# Patient Record
Sex: Female | Born: 1990
Health system: Southern US, Community
[De-identification: ages and names within clinical notes are randomized; demographics above are authoritative.]

## PROBLEM LIST (undated history)

## (undated) DIAGNOSIS — I341 Nonrheumatic mitral (valve) prolapse: Secondary | ICD-10-CM

## (undated) DIAGNOSIS — I10 Essential (primary) hypertension: Secondary | ICD-10-CM

## (undated) HISTORY — PX: TUBAL LIGATION: SHX77

## (undated) HISTORY — PX: ENDOMETRIAL ABLATION: SHX621

---

## 2016-10-12 ENCOUNTER — Encounter (HOSPITAL_BASED_OUTPATIENT_CLINIC_OR_DEPARTMENT_OTHER): Payer: Self-pay | Admitting: *Deleted

## 2016-10-12 ENCOUNTER — Emergency Department (HOSPITAL_BASED_OUTPATIENT_CLINIC_OR_DEPARTMENT_OTHER)
Admission: EM | Admit: 2016-10-12 | Discharge: 2016-10-12 | Disposition: A | Discharge status: Home / Self Care | Payer: Medicaid Other | Attending: Emergency Medicine | Admitting: Emergency Medicine

## 2016-10-12 DIAGNOSIS — J029 Acute pharyngitis, unspecified: Secondary | ICD-10-CM | POA: Insufficient documentation

## 2016-10-12 DIAGNOSIS — F17200 Nicotine dependence, unspecified, uncomplicated: Secondary | ICD-10-CM | POA: Diagnosis not present

## 2016-10-12 HISTORY — DX: Nonrheumatic mitral (valve) prolapse: I34.1

## 2016-10-12 LAB — RAPID STREP SCREEN (MED CTR MEBANE ONLY): Streptococcus, Group A Screen (Direct): NEGATIVE

## 2016-10-12 MED ORDER — MELOXICAM 15 MG PO TABS: 15.0000 mg | ORAL_TABLET | Freq: Every day | ORAL | 0 refills | Status: DC | PRN

## 2016-10-12 MED ORDER — DEXAMETHASONE 6 MG PO TABS
12.0000 mg | ORAL_TABLET | Freq: Once | ORAL | Status: AC
  Administered 2016-10-12: 12 mg via ORAL
  Filled 2016-10-12: qty 2

## 2016-10-12 MED FILL — MELOXICAM 15 MG TABLET: 15 | 7 days supply | Qty: 7 | Fill #0

## 2016-10-12 NOTE — ED Provider Notes (Signed)
[facility]   CSN: [id number] Arrival date & time: 10/12/16  4098     History   Chief Complaint Chief Complaint  Patient presents with  . Sore Throat    HPI Rose Ramos is a 26 y.o. female.  HPI   26 year old female with sore throat. Symptom onset Tuesday. Persistent since then. Subjective fever. Associated with intermittent and nonproductive cough. No ear pain.Has not tried taking anything for her symptoms.  Past Medical History:  Diagnosis Date  . Mitral valve prolapse     There are no active problems to display for this patient.   History reviewed. No pertinent surgical history.  OB History    No data available       Home Medications    Prior to Admission medications   Not on File    Family History History reviewed. No pertinent family history.  Social History Social History  Substance Use Topics  . Smoking status: Current Every Day Smoker  . Smokeless tobacco: Never Used  . Alcohol use Not on file     Allergies   Patient has no known allergies.   Review of Systems Review of Systems  All systems reviewed and negative, other than as noted in HPI.  Physical Exam Updated Vital Signs BP 128/76 (BP Location: Right Arm)   Pulse 100   Temp 98.4 F (36.9 C) (Oral)   Resp 16   Ht 5\' 3"  (1.6 m)   Wt 98.4 kg (217 lb)   LMP 10/05/2016   SpO2 100%   BMI 38.44 kg/m   Physical Exam  Constitutional: She appears well-developed and well-nourished. No distress.  HENT:  Head: Normocephalic and atraumatic.  Right Ear: External ear normal.  Left Ear: External ear normal.  Mouth/Throat: Oropharyngeal exudate present.  Exudative pharyngitis. Uvula midline. Normal sounding voice. Neck is supple. No adenopathy. Submental tissue soft.  Eyes: Conjunctivae are normal. Right eye exhibits no discharge. Left eye exhibits no discharge.  Neck: Neck supple.  Cardiovascular: Normal rate, regular rhythm and normal heart sounds.  Exam  reveals no gallop and no friction rub.   No murmur heard. Pulmonary/Chest: Effort normal and breath sounds normal. No respiratory distress.  Abdominal: Soft. She exhibits no distension. There is no tenderness.  Musculoskeletal: She exhibits no edema or tenderness.  Neurological: She is alert.  Skin: Skin is warm and dry.  Psychiatric: She has a normal mood and affect. Her behavior is normal. Thought content normal.  Nursing note and vitals reviewed.    ED Treatments / Results  Labs (all labs ordered are listed, but only abnormal results are displayed) Labs Reviewed  RAPID STREP SCREEN (NOT AT [facility])  CULTURE, GROUP A STREP [facility])    EKG  EKG Interpretation None       Radiology No results found.  Procedures Procedures (including critical care time)  Medications Ordered in ED Medications  dexamethasone (DECADRON) tablet 12 mg (12 mg Oral Given 10/12/16 1191)     Initial Impression / Assessment and Plan / ED Course  I have reviewed the triage vital signs and the nursing notes.  Pertinent labs & imaging results that were available during my care of the patient were reviewed by me and considered in my medical decision making (see chart for details).     Exudative pharyngitis. No significant concern for airway compromise. Rapid strep negative. Symptomatic treatment.  Final Clinical Impressions(s) / ED Diagnoses   Final diagnoses:  Pharyngitis, unspecified etiology    New Prescriptions  New Prescriptions   No medications on file     Raeford RazorKohut, Jayceion Lisenby, MD 10/22/16 [phone]

## 2016-10-12 NOTE — ED Triage Notes (Signed)
Pt reports sore throat with intermittent cough and subjective temps x Tuesday.

## 2016-10-14 ENCOUNTER — Encounter (HOSPITAL_BASED_OUTPATIENT_CLINIC_OR_DEPARTMENT_OTHER): Payer: Self-pay | Admitting: Emergency Medicine

## 2016-10-14 ENCOUNTER — Emergency Department (HOSPITAL_BASED_OUTPATIENT_CLINIC_OR_DEPARTMENT_OTHER)
Admission: EM | Admit: 2016-10-14 | Discharge: 2016-10-14 | Disposition: A | Discharge status: Home / Self Care | Payer: Medicaid Other | Attending: Emergency Medicine | Admitting: Emergency Medicine

## 2016-10-14 DIAGNOSIS — F17200 Nicotine dependence, unspecified, uncomplicated: Secondary | ICD-10-CM | POA: Diagnosis not present

## 2016-10-14 DIAGNOSIS — Z79899 Other long term (current) drug therapy: Secondary | ICD-10-CM | POA: Diagnosis not present

## 2016-10-14 DIAGNOSIS — J029 Acute pharyngitis, unspecified: Secondary | ICD-10-CM | POA: Diagnosis not present

## 2016-10-14 DIAGNOSIS — K120 Recurrent oral aphthae: Secondary | ICD-10-CM | POA: Diagnosis not present

## 2016-10-14 LAB — CULTURE, GROUP A STREP (THRC)

## 2016-10-14 MED ORDER — CHLORHEXIDINE GLUCONATE 0.12 % MT SOLN: 15.0000 mL | Freq: Two times a day (BID) | OROMUCOSAL | 0 refills | Status: DC

## 2016-10-14 NOTE — ED Triage Notes (Signed)
Pt seen here Friday for sore throat, given meloxicam. Pt reports now having continued throat pain, sores in her mouth, and swollen lymph nodes to the L side of her neck.

## 2016-10-14 NOTE — ED Provider Notes (Signed)
[facility]   CSN: [id number] Arrival date & time: 10/14/16  1636     History   Chief Complaint Chief Complaint  Patient presents with  . Sore Throat    HPI Rose Ramos is a 26 y.o. female who presents with intermittent sore throat and sores in her mouth is been ongoing for the last 3 days. Patient was seen at [facility] ED on 10/12/16 for 4 days of sore throat. Her rapid strep was negative at that time. Conservative therapies were discussed with patient was discharged home with Modic for symptomatic relief. Patient reports that since then she has had intermittent  sore throat. She states additionally took one dose of of mobic. She states that the sore throat has improved and only occurs intermittently now. She denies any difficulty swallowing, difficulty tolerating PO, voice changes. Patient states she came to the emergency department today because she noticed some sores to the lower aspect of her lip. Patient denies any chest pain, difficulty breathing, nausea/vomiting, abdominal pain, dental pain, facial swelling.  The history is provided by the patient.    Past Medical History:  Diagnosis Date  . Mitral valve prolapse     There are no active problems to display for this patient.   History reviewed. No pertinent surgical history.  OB History    No data available       Home Medications    Prior to Admission medications   Medication Sig Start Date End Date Taking? Authorizing Provider  chlorhexidine (PERIDEX) 0.12 % solution Use as directed 15 mLs in the mouth or throat 2 (two) times daily. 10/14/16   Maxwell Caul, PA-C  meloxicam (MOBIC) 15 MG tablet Take 1 tablet (15 mg total) by mouth daily as needed for pain. 10/12/16   Raeford Razor, MD    Family History No family history on file.  Social History Social History  Substance Use Topics  . Smoking status: Current Every Day Smoker  . Smokeless tobacco: Never Used  . Alcohol use Not  on file     Allergies   Patient has no known allergies.   Review of Systems Review of Systems  Constitutional: Negative for fever.  HENT: Positive for sore throat. Negative for dental problem, drooling and facial swelling.        Lip sores  Respiratory: Negative for cough and shortness of breath.   Cardiovascular: Negative for chest pain.  Gastrointestinal: Negative for abdominal pain, nausea and vomiting.  Neurological: Negative for headaches.     Physical Exam Updated Vital Signs BP (!) 150/77 (BP Location: Left Arm)   Pulse 93   Temp 98.5 F (36.9 C) (Oral)   Resp 18   LMP 10/05/2016   SpO2 100%   Physical Exam  Constitutional: She appears well-developed and well-nourished.  Sitting comfortably on examination table  HENT:  Head: Normocephalic and atraumatic.  Mouth/Throat: Uvula is midline and mucous membranes are normal. No trismus in the jaw. Normal dentition. No dental abscesses. Posterior oropharyngeal erythema present.  Uvula is midline. No trismus, no evidence of peritonsillar abscess. No gingival erythema or swelling. No evidence of dental abscess. Posterior oropharynx is slightly erythematous but no exudates. No facial or neck swelling. Small aphthous ulcers noted to the anterior aspect of the lower lip and to the anterior aspect of the lower left lip. No other oral lesions.  Eyes: Conjunctivae and EOM are normal. Right eye exhibits no discharge. Left eye exhibits no discharge. No scleral icterus.  Neck: Normal range of motion.  Pulmonary/Chest: Effort normal.  Lymphadenopathy:    She has cervical adenopathy.       Left cervical: Superficial cervical adenopathy present.  Neurological: She is alert.  Skin: Skin is warm and dry.  Psychiatric: She has a normal mood and affect. Her speech is normal and behavior is normal.  Nursing note and vitals reviewed.    ED Treatments / Results  Labs (all labs ordered are listed, but only abnormal results are  displayed) Labs Reviewed - No data to display  EKG  EKG Interpretation None       Radiology No results found.  Procedures Procedures (including critical care time)  Medications Ordered in ED Medications - No data to display   Initial Impression / Assessment and Plan / ED Course  I have reviewed the triage vital signs and the nursing notes.  Pertinent labs & imaging results that were available during my care of the patient were reviewed by me and considered in my medical decision making (see chart for details).     26 year old female who presents with persistent sore throat and lip sores. Seen on 10/12/16 and diagnosed with pharyngitis. Conservative therapies discuss at home. Patient returns today because of sores on her lip. No fever, chills, difficulty swelling, difficulty breathing, vomiting. Patient is afebrile, non-toxic appearing, sitting comfortably on examination table. Vital signs reviewed and stable. Physical exam is consistent with aphthous ulcers located on the lower lip. No evidence of dental abscess or peritonsillar abscess. History/physical examination concerning for Ludwig angina or peritonsillar abscess. Instructed patient on conservative therapies at home and instructed her to do salt water rinses. Provided patient with a list of clinic resources to use if he does not have a PCP. Instructed to call them today to arrange follow-up in the next 24-48 hours. Strict return precautions discussed. Patient expresses understanding and agreement to plan.   Final Clinical Impressions(s) / ED Diagnoses   Final diagnoses:  Pharyngitis, unspecified etiology  Aphthous ulcer    New Prescriptions New Prescriptions   CHLORHEXIDINE (PERIDEX) 0.12 % SOLUTION    Use as directed 15 mLs in the mouth or throat 2 (two) times daily.     Maxwell CaulLayden, Anna Livers A, PA-C 10/14/16 1806    Gwyneth SproutPlunkett, Whitney, MD 10/15/16 [phone]

## 2016-10-14 NOTE — Discharge Instructions (Signed)
You can take Mobic, Tylenol or Ibuprofen as directed for pain.  As we discussed you can use the mouthwash or do salt water rinses.   Follow-up with her primary care doctor next 2-4 days. If he do not have a primary care doctor, he can use 1 listed above.  Return the emergency Department for any worsening sore throat, difficulty swallowing, difficulty breathing, vomiting, changes in voice, chest pain or any other worsening or concerning symptoms.

## 2016-11-02 ENCOUNTER — Encounter (HOSPITAL_BASED_OUTPATIENT_CLINIC_OR_DEPARTMENT_OTHER): Payer: Self-pay | Admitting: *Deleted

## 2016-11-02 ENCOUNTER — Emergency Department (HOSPITAL_BASED_OUTPATIENT_CLINIC_OR_DEPARTMENT_OTHER)
Admission: EM | Admit: 2016-11-02 | Discharge: 2016-11-02 | Disposition: A | Discharge status: Home / Self Care | Payer: Medicaid Other | Attending: Emergency Medicine | Admitting: Emergency Medicine

## 2016-11-02 DIAGNOSIS — R300 Dysuria: Secondary | ICD-10-CM | POA: Diagnosis not present

## 2016-11-02 DIAGNOSIS — M545 Low back pain: Secondary | ICD-10-CM | POA: Diagnosis present

## 2016-11-02 DIAGNOSIS — R1030 Lower abdominal pain, unspecified: Secondary | ICD-10-CM

## 2016-11-02 DIAGNOSIS — F17200 Nicotine dependence, unspecified, uncomplicated: Secondary | ICD-10-CM | POA: Insufficient documentation

## 2016-11-02 LAB — URINALYSIS, ROUTINE W REFLEX MICROSCOPIC
Bilirubin Urine: NEGATIVE
Glucose, UA: NEGATIVE mg/dL
Hgb urine dipstick: NEGATIVE
Ketones, ur: NEGATIVE mg/dL
Leukocytes, UA: NEGATIVE
NITRITE: NEGATIVE
PH: 6 (ref 5.0–8.0)
Protein, ur: NEGATIVE mg/dL
SPECIFIC GRAVITY, URINE: 1.025 (ref 1.005–1.030)

## 2016-11-02 LAB — PREGNANCY, URINE: PREG TEST UR: NEGATIVE

## 2016-11-02 NOTE — ED Triage Notes (Signed)
Pt reports dysuria with low back pain x last night, denies any fevers.

## 2016-11-02 NOTE — ED Provider Notes (Signed)
[facility]   CSN: [id number] Arrival date & time: 11/02/16  0704     History   Chief Complaint Chief Complaint  Patient presents with  . Dysuria    HPI Rose Ramos is a 26 y.o. female.  HPI Patient presents with low back pain that radiates to the lower abdomen. Not worse with movements. 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 Has had some dysuria. States the urine is cloudy and has had to go more frequently. This also began yesterday and this morning. No vaginal bleeding or discharge. States her this could be pregnant. Does not think there is a chance for an STD. She tends to get crampy pains before menses and she is due to start her menses but states this pain feels different. No fevers or chills. No flank pain. No rash. Past Medical History:  Diagnosis Date  . Mitral valve prolapse     There are no active problems to display for this patient.   History reviewed. No pertinent surgical history.  OB History    No data available       Home Medications    Prior to Admission medications   Not on File    Family History History reviewed. No pertinent family history.  Social History Social History  Substance Use Topics  . Smoking status: Current Every Day Smoker  . Smokeless tobacco: Never Used  . Alcohol use Not on file     Allergies   Patient has no known allergies.   Review of Systems Review of Systems  Constitutional: Negative for appetite change and fever.  HENT: Negative for congestion.   Cardiovascular: Negative for chest pain.  Gastrointestinal: Positive for abdominal pain.  Genitourinary: Negative for vaginal bleeding, vaginal discharge and vaginal pain.  Musculoskeletal: Positive for back pain.  Neurological: Negative for numbness.  Psychiatric/Behavioral: Negative for behavioral problems.     Physical Exam Updated Vital Signs BP 116/82 (BP Location: Left Arm)   Pulse 76   Temp 98.3 F  (36.8 C) (Oral)   Resp 16   Ht  (1.6 m)   Wt 98.4 kg (217 lb)   LMP 10/05/2016   SpO2 100%   BMI 38.44 kg/m   Physical Exam  Constitutional: She appears well-developed.  HENT:  Head: Normocephalic.  Eyes: Pupils are equal, round, and reactive to light.  Cardiovascular: Normal rate.   Pulmonary/Chest: Effort normal. No respiratory distress. She has no wheezes.  Abdominal: She exhibits no mass. There is no tenderness.  Musculoskeletal: She exhibits no edema.  No tenderness on lumbar spine or paraspinal area.  Neurological: She is alert.  Skin: Skin is warm.     ED Treatments / Results  Labs (all labs ordered are listed, but only abnormal results are displayed) Labs Reviewed  URINALYSIS, ROUTINE W REFLEX MICROSCOPIC  PREGNANCY, URINE    EKG  EKG Interpretation None       Radiology No results found.  Procedures Procedures (including critical care time)  Medications Ordered in ED Medications - No data to display   Initial Impression / Assessment and Plan / ED Course  I have reviewed the triage vital signs and the nursing notes.  Pertinent labs & imaging results that were available during my care of the patient were reviewed by me and considered in my medical decision making (see chart for details).     Patient presents with lower back and abdominal pain. Some dysuria. Urine is reassuring and  not pregnant. Initially planned for pelvic exam but patient deferred it. Will discharge home. Follow-up as needed.  Final Clinical Impressions(s) / ED Diagnoses   Final diagnoses:  Dysuria  Lower abdominal pain    New Prescriptions New Prescriptions   No medications on file     Benjiman Core, MD 11/02/16 [phone]

## 2016-11-15 ENCOUNTER — Encounter (HOSPITAL_BASED_OUTPATIENT_CLINIC_OR_DEPARTMENT_OTHER): Payer: Self-pay | Admitting: *Deleted

## 2016-11-15 ENCOUNTER — Emergency Department (HOSPITAL_BASED_OUTPATIENT_CLINIC_OR_DEPARTMENT_OTHER)
Admission: EM | Admit: 2016-11-15 | Discharge: 2016-11-15 | Disposition: A | Discharge status: Home / Self Care | Payer: Medicaid Other | Attending: Emergency Medicine | Admitting: Emergency Medicine

## 2016-11-15 ENCOUNTER — Emergency Department (HOSPITAL_BASED_OUTPATIENT_CLINIC_OR_DEPARTMENT_OTHER): Payer: Medicaid Other

## 2016-11-15 DIAGNOSIS — F17200 Nicotine dependence, unspecified, uncomplicated: Secondary | ICD-10-CM | POA: Insufficient documentation

## 2016-11-15 DIAGNOSIS — M79605 Pain in left leg: Secondary | ICD-10-CM | POA: Diagnosis not present

## 2016-11-15 DIAGNOSIS — Z79899 Other long term (current) drug therapy: Secondary | ICD-10-CM | POA: Diagnosis not present

## 2016-11-15 MED ORDER — IBUPROFEN 600 MG PO TABS: 600.0000 mg | ORAL_TABLET | Freq: Four times a day (QID) | ORAL | 0 refills | Status: DC | PRN

## 2016-11-15 MED ORDER — METHOCARBAMOL 500 MG PO TABS: 1000.0000 mg | ORAL_TABLET | Freq: Three times a day (TID) | ORAL | 0 refills | Status: AC | PRN

## 2016-11-15 NOTE — ED Provider Notes (Signed)
[facility]-EMERGENCY DEPT Provider Note   CSN: [id number] Arrival date & time: 11/15/16  1132     History   Chief Complaint Chief Complaint  Patient presents with  . Leg Pain    HPI Rose Ramos is a 26 y.o. female.  HPI Patient presents with left leg pain waking her up this morning. The pain is mostly located behind the left knee and radiates down the calf. Describes the pain is sharp. Worse with ambulation. Patient states that she has chronic back and bilateral leg pain. 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 No focal weakness or numbness. Past Medical History:  Diagnosis Date  . Mitral valve prolapse     There are no active problems to display for this patient.   History reviewed. No pertinent surgical history.  OB History    No data available       Home Medications    Prior to Admission medications   Medication Sig Start Date End Date Taking? Authorizing Provider  ibuprofen (ADVIL,MOTRIN) 600 MG tablet Take 1 tablet (600 mg total) by mouth every 6 (six) hours as needed. 11/15/16   Loren Racer, MD  methocarbamol (ROBAXIN) 500 MG tablet Take 2 tablets (1,000 mg total) by mouth every 8 (eight) hours as needed for muscle spasms. 11/15/16   Loren Racer, MD    Family History No family history on file.  Social History Social History  Substance Use Topics  . Smoking status: Current Every Day Smoker  . Smokeless tobacco: Never Used  . Alcohol use Not on file     Allergies   Patient has no known allergies.   Review of Systems Review of Systems  Constitutional: Negative for chills and fever.  Respiratory: Negative for cough and shortness of breath.   Cardiovascular: Negative for chest pain.  Gastrointestinal: Negative for abdominal pain, diarrhea and nausea.  Genitourinary: Negative for dysuria, flank pain, frequency and hematuria.  Musculoskeletal: Positive for arthralgias, back pain and myalgias. Negative for joint  swelling, neck pain and neck stiffness.  Skin: Negative for rash and wound.  Neurological: Negative for dizziness, weakness, light-headedness, numbness and headaches.  All other systems reviewed and are negative.    Physical Exam Updated Vital Signs BP 101/78   Pulse 90   Temp 98.2 F (36.8 C) (Oral)   Resp 16   Ht  (1.6 m)   Wt 98.4 kg (217 lb)   LMP 11/10/2016   SpO2 99%   BMI 38.44 kg/m   Physical Exam  Constitutional: She is oriented to person, place, and time. She appears well-developed and well-nourished. No distress.  HENT:  Head: Normocephalic and atraumatic.  Mouth/Throat: Oropharynx is clear and moist. No oropharyngeal exudate.  Eyes: Pupils are equal, round, and reactive to light. EOM are normal.  Neck: Normal range of motion. Neck supple.  Cardiovascular: Normal rate and regular rhythm.  Exam reveals no gallop and no friction rub.   No murmur heard. Pulmonary/Chest: Effort normal and breath sounds normal.  Abdominal: Soft. Bowel sounds are normal. There is no tenderness. There is no rebound and no guarding.  Musculoskeletal: Normal range of motion. She exhibits tenderness. She exhibits no edema.  Minimal midline lumbar tenderness. No CVA tenderness. Negative straight leg raise bilaterally. Patient does have some tenderness to the medial of the left knee and mild fullness in the popliteal fossa. No definite calf tenderness. Distal pulses are 2+.  Neurological: She is alert and oriented to person,  place, and time.  5/5 motor in all extremities. Sensation fully intact.  Skin: Skin is warm and dry. Capillary refill takes less than 2 seconds. No rash noted. She is not diaphoretic. No erythema.  Psychiatric: She has a normal mood and affect. Her behavior is normal.  Nursing note and vitals reviewed.    ED Treatments / Results  Labs (all labs ordered are listed, but only abnormal results are displayed) Labs Reviewed - No data to display  EKG  EKG  Interpretation None       Radiology US Venous Img Lower Unilateral Left  Result Date: 11/15/2016 CLINICAL DATA:  Left lower extremity pain EXAM: Left LOWER EXTREMITY VENOUS DOPPLER ULTRASOUND TECHNIQUE: Gray-scale sonography with graded compression, as well as color Doppler and duplex ultrasound were performed to evaluate the lower extremity deep venous systems from the level of the common femoral vein and including the common femoral, femoral, profunda femoral, popliteal and calf veins including the posterior tibial, peroneal and gastrocnemius veins when visible. The superficial great saphenous vein was also interrogated. Spectral Doppler was utilized to evaluate flow at rest and with distal augmentation maneuvers in the common femoral, femoral and popliteal veins. COMPARISON:  None. FINDINGS: Contralateral Common Femoral Vein: Respiratory phasicity is normal and symmetric with the symptomatic side. No evidence of thrombus. Normal compressibility. Common Femoral Vein: No evidence of thrombus. Normal compressibility, respiratory phasicity and response to augmentation. Saphenofemoral Junction: No evidence of thrombus. Normal compressibility and flow on color Doppler imaging. Profunda Femoral Vein: No evidence of thrombus. Normal compressibility and flow on color Doppler imaging. Femoral Vein: No evidence of thrombus. Normal compressibility, respiratory phasicity and response to augmentation. Popliteal Vein: No evidence of thrombus. Normal compressibility, respiratory phasicity and response to augmentation. Calf Veins: No evidence of thrombus. Normal compressibility and flow on color Doppler imaging. Superficial Great Saphenous Vein: No evidence of thrombus. Normal compressibility and flow on color Doppler imaging. Venous Reflux:  None. Other Findings:  None. IMPRESSION: No evidence of DVT within the left lower extremity. Electronically Signed   By: Paulina Fusi M.D.   On: 11/15/2016 14:50     Procedures Procedures (including critical care time)  Medications Ordered in ED Medications - No data to display   Initial Impression / Assessment and Plan / ED Course  I have reviewed the triage vital signs and the nursing notes.  Pertinent labs & imaging results that were available during my care of the patient were reviewed by me and considered in my medical decision making (see chart for details).     Ultrasound negative for DVT. Question muscle strain versus radiculopathy. We'll treat symptomatically. Advised to follow-up with sports medicine as needed.  Final Clinical Impressions(s) / ED Diagnoses   Final diagnoses:  Left leg pain    New Prescriptions Discharge Medication List as of 11/15/2016  3:01 PM    START taking these medications   Details  ibuprofen (ADVIL,MOTRIN) 600 MG tablet Take 1 tablet (600 mg total) by mouth every 6 (six) hours as needed., Starting Thu 11/15/2016, Print    methocarbamol (ROBAXIN) 500 MG tablet Take 2 tablets (1,000 mg total) by mouth every 8 (eight) hours as needed for muscle spasms., Starting Thu 11/15/2016, Print         Loren Racer, MD 11/16/16 [phone]

## 2016-11-15 NOTE — ED Triage Notes (Signed)
Pain in her left upper leg woke her this am. 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

## 2016-11-15 NOTE — ED Notes (Signed)
Patient transported to Ultrasound 

## 2016-11-15 NOTE — Discharge Instructions (Signed)
You do not have a blood clot in your leg. Take medication as prescribed. If your symptoms persist follow-up with Dr. Pearletha Forge.

## 2018-07-17 IMAGING — US US EXTREM LOW VENOUS*L*
1 series · 13 of 24 positions shown · non-contrast
Comparison: None.

CLINICAL DATA: Left lower extremity pain



[Series 1: us extrem low venous*left* · 0.11mm/px · 13 of 33 slices shown]
[im 1/33]
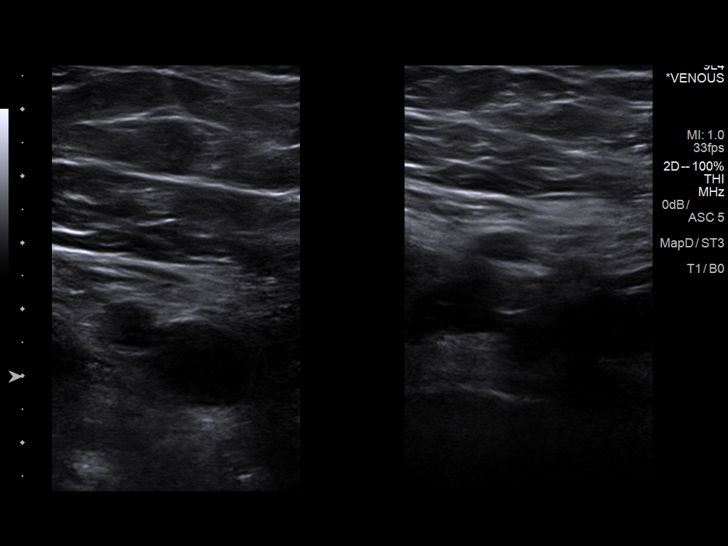
[im 3/33]
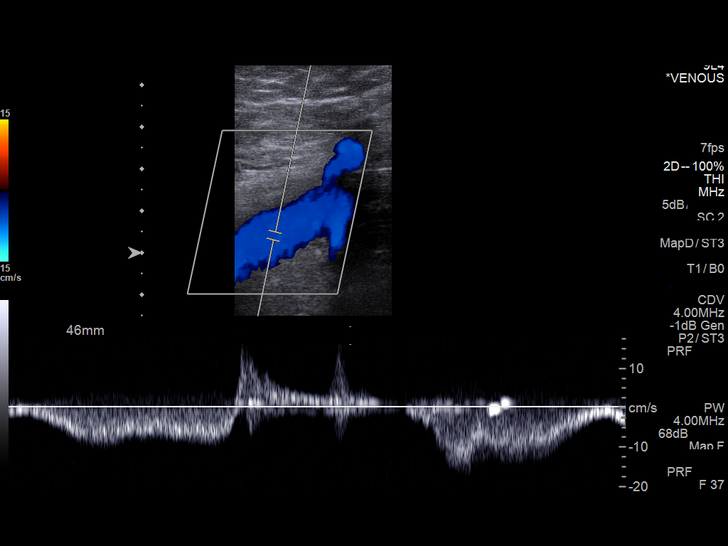
[im 6/33]
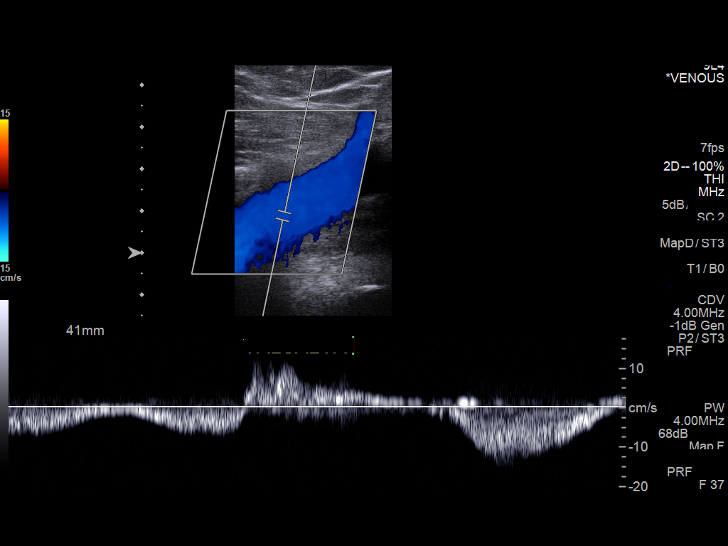
[im 9/33]
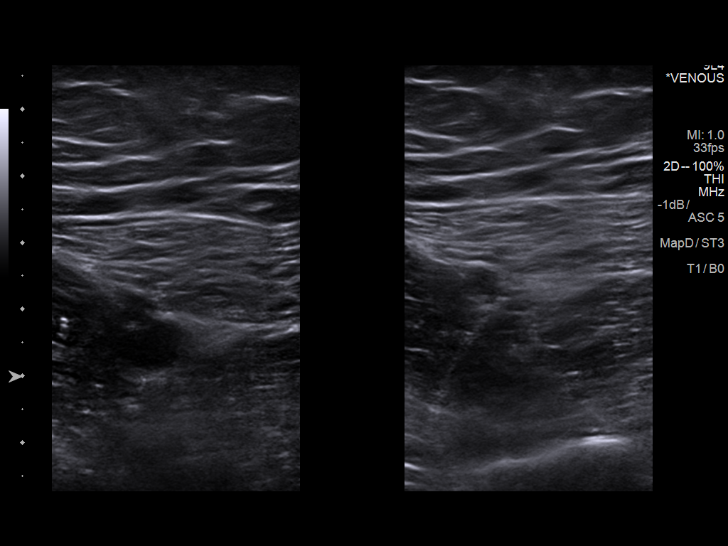
[im 12/33]
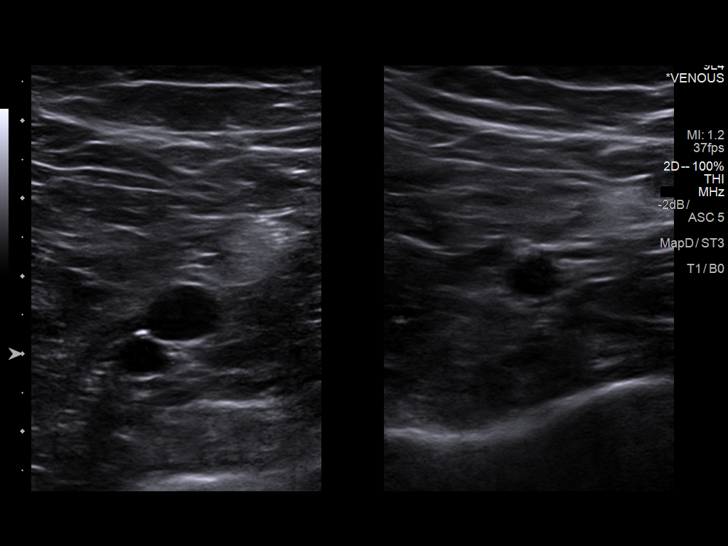
[im 14/33]
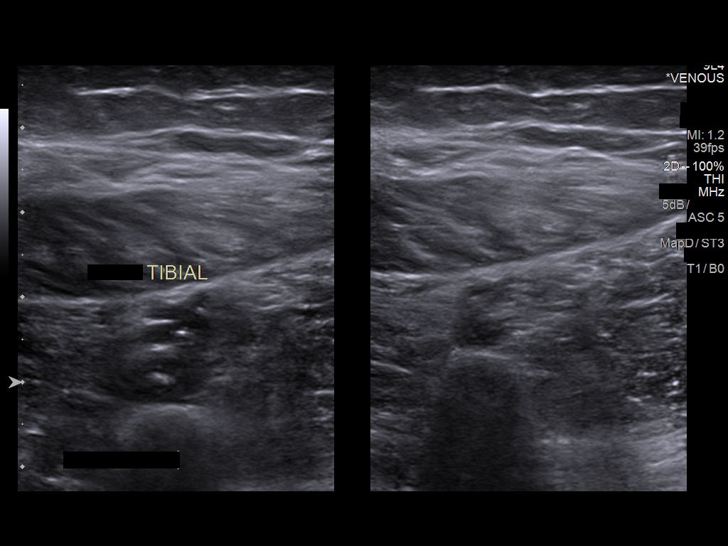
[im 17/33]
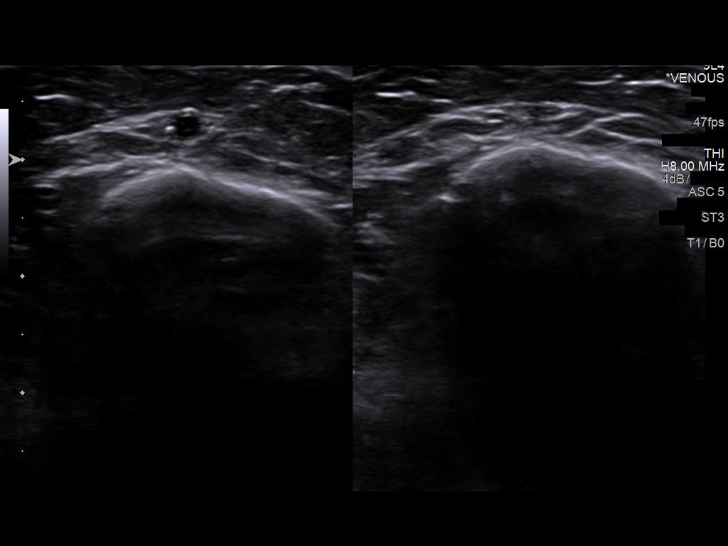
[im 19/33]
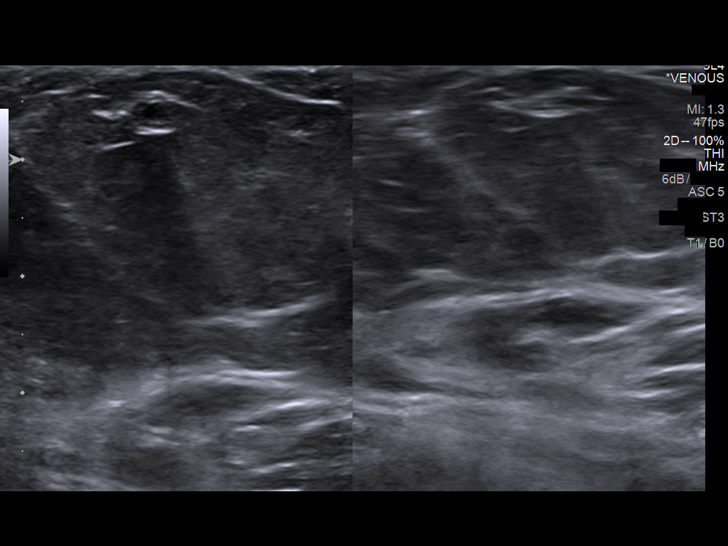
[im 21/33]
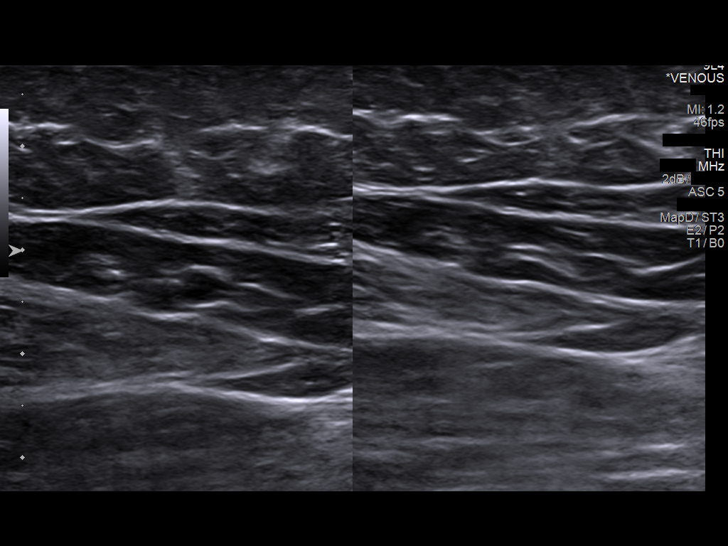
[im 24/33]
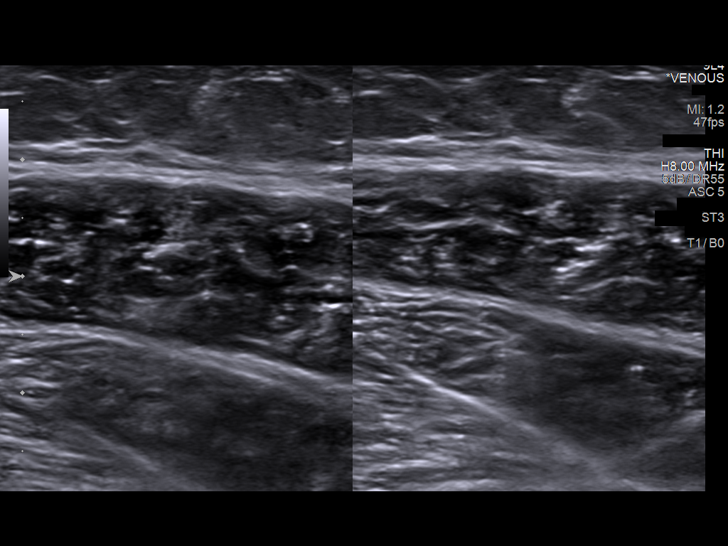
[im 27/33]
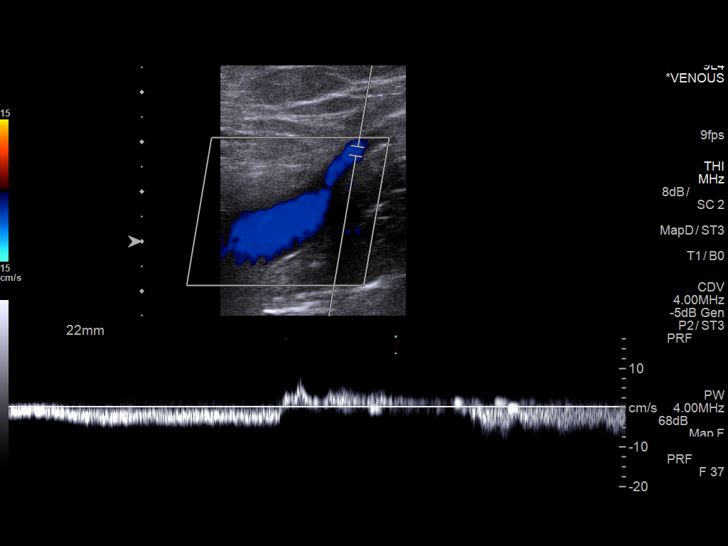
[im 30/33]
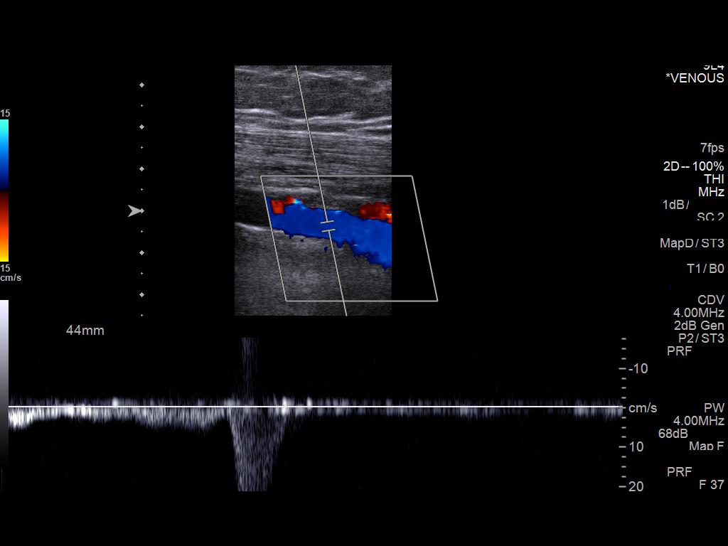
[im 33/33]
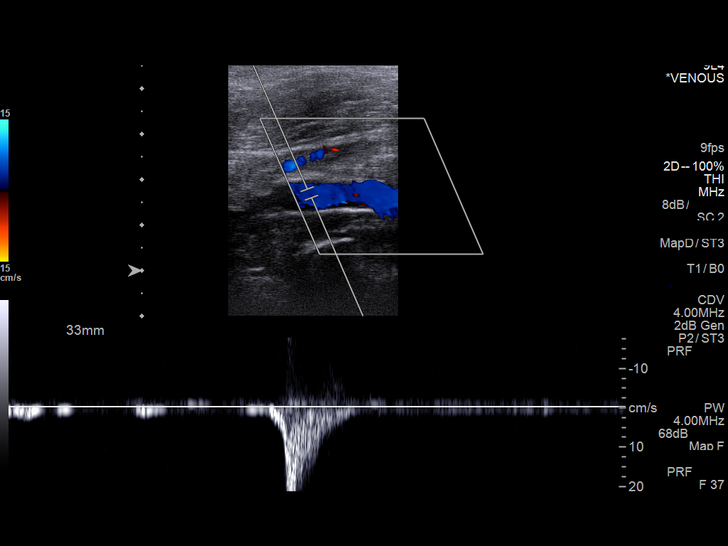

[13 of 24 positions shown; findings below may reference images not displayed]

FINDINGS: Contralateral Common Femoral Vein: Respiratory phasicity is normal
and symmetric with the symptomatic side. No evidence of thrombus.
Normal compressibility.

Common Femoral Vein: No evidence of thrombus. Normal
compressibility, respiratory phasicity and response to augmentation.

Saphenofemoral Junction: No evidence of thrombus. Normal
compressibility and flow on color Doppler imaging.

Profunda Femoral Vein: No evidence of thrombus. Normal
compressibility and flow on color Doppler imaging.

Femoral Vein: No evidence of thrombus. Normal compressibility,
respiratory phasicity and response to augmentation.

Popliteal Vein: No evidence of thrombus. Normal compressibility,
respiratory phasicity and response to augmentation.

Calf Veins: No evidence of thrombus. Normal compressibility and flow
on color Doppler imaging.

Superficial Great Saphenous Vein: No evidence of thrombus. Normal
compressibility and flow on color Doppler imaging.

Venous Reflux:  None.

Other Findings:  None.
IMPRESSION: No evidence of DVT within the left lower extremity.

## 2020-01-29 ENCOUNTER — Ambulatory Visit
Admission: RE | Admit: 2020-01-29 | Discharge: 2020-01-29 | Disposition: A | Discharge status: Home / Self Care | Payer: Medicaid Other | Source: Ambulatory Visit | Attending: Internal Medicine | Admitting: Internal Medicine

## 2020-01-29 ENCOUNTER — Other Ambulatory Visit: Payer: Self-pay | Admitting: Internal Medicine

## 2020-01-29 DIAGNOSIS — R053 Chronic cough: Secondary | ICD-10-CM

## 2020-02-24 ENCOUNTER — Encounter (HOSPITAL_BASED_OUTPATIENT_CLINIC_OR_DEPARTMENT_OTHER): Payer: Self-pay

## 2020-02-24 ENCOUNTER — Other Ambulatory Visit: Payer: Self-pay

## 2020-02-24 ENCOUNTER — Emergency Department (HOSPITAL_BASED_OUTPATIENT_CLINIC_OR_DEPARTMENT_OTHER)
Admission: EM | Admit: 2020-02-24 | Discharge: 2020-02-24 | Disposition: A | Discharge status: Home / Self Care | Payer: Medicaid Other | Attending: Emergency Medicine | Admitting: Emergency Medicine

## 2020-02-24 ENCOUNTER — Emergency Department (HOSPITAL_BASED_OUTPATIENT_CLINIC_OR_DEPARTMENT_OTHER): Payer: Medicaid Other

## 2020-02-24 DIAGNOSIS — B349 Viral infection, unspecified: Secondary | ICD-10-CM

## 2020-02-24 DIAGNOSIS — Z87891 Personal history of nicotine dependence: Secondary | ICD-10-CM | POA: Diagnosis not present

## 2020-02-24 DIAGNOSIS — R079 Chest pain, unspecified: Secondary | ICD-10-CM

## 2020-02-24 DIAGNOSIS — I10 Essential (primary) hypertension: Secondary | ICD-10-CM | POA: Insufficient documentation

## 2020-02-24 DIAGNOSIS — Z20822 Contact with and (suspected) exposure to covid-19: Secondary | ICD-10-CM | POA: Insufficient documentation

## 2020-02-24 HISTORY — DX: Essential (primary) hypertension: I10

## 2020-02-24 LAB — TROPONIN I (HIGH SENSITIVITY): Troponin I (High Sensitivity): 2 ng/L (ref <18)

## 2020-02-24 LAB — SARS CORONAVIRUS 2 (TAT 6-24 HRS): SARS Coronavirus 2: NEGATIVE

## 2020-02-24 NOTE — ED Provider Notes (Signed)
[facility] EMERGENCY DEPARTMENT Provider Note   CSN: [id number] Arrival date & time: 02/24/20  0749     History Chief Complaint  Patient presents with  . Generalized Body Aches  . Chest Pain    Rose Ramos is a 30 y.o. female with a history of MVP, hypertension, high cholesterol, smoking, presenting to emergency department with chest pain and generalized body aches. The patient reports that her son had a cough, congestion, and fevers within the past week. She herself began having congestion, chest pain, and body aches yesterday. She describes left-sided lower chest pain, which is a pressure and heaviness sensation, waxes and wanes, nothing makes it better or worse. She said this is similar to chest pain she has had in the past. She is also feeling pain in her back, across her thoracic back. She says she feels like she has muscle aches.  She also notes that she feels that she has "swelling along my gums". Specifically she is having pain and soreness around her right lower gumline. She feels like there is swelling in her neck.  She does report a history of chest pain with exertion. She has a cardiologist appointment on Friday for the first time. She reports a significant family history of cardiac disease and MI including in her father.  She says that she used to smoke black and milds but quit about 3 weeks ago.  HPI     Past Medical History:  Diagnosis Date  . Hypertension   . Mitral valve prolapse     There are no problems to display for this patient.   History reviewed. No pertinent surgical history.   OB History   No obstetric history on file.     History reviewed. No pertinent family history.  Social History   Tobacco Use  . Smoking status: Former Games developer  . Smokeless tobacco: Never Used  Substance Use Topics  . Alcohol use: Yes    Comment: 2-3 weeks  . Drug use: Never    Home Medications Prior to Admission medications   Medication Sig Start  Date End Date Taking? Authorizing Provider  ibuprofen (ADVIL,MOTRIN) 600 MG tablet Take 1 tablet (600 mg total) by mouth every 6 (six) hours as needed. 11/15/16   Loren Racer, MD  methocarbamol (ROBAXIN) 500 MG tablet Take 2 tablets (1,000 mg total) by mouth every 8 (eight) hours as needed for muscle spasms. 11/15/16   Loren Racer, MD    Allergies    Patient has no known allergies.  Review of Systems   Review of Systems  Constitutional: Negative for chills and fever.  HENT: Positive for congestion. Negative for sore throat.   Eyes: Negative for pain and visual disturbance.  Respiratory: Negative for cough and shortness of breath.   Cardiovascular: Positive for chest pain. Negative for palpitations.  Gastrointestinal: Negative for abdominal pain and vomiting.  Genitourinary: Negative for dysuria and hematuria.  Musculoskeletal: Positive for arthralgias, back pain and myalgias.  Skin: Negative for color change and rash.  Neurological: Negative for syncope.  All other systems reviewed and are negative.   Physical Exam Updated Vital Signs BP 118/78 (BP Location: Right Arm)   Pulse 81   Temp 98.1 F (36.7 C) (Oral)   Resp 20   Ht 5\' 3"  (1.6 m)   Wt 97.5 kg   SpO2 99%   BMI 38.09 kg/m   Physical Exam Constitutional:      General: She is not in acute distress.    Appearance:  She is obese.  HENT:     Head: Normocephalic and atraumatic.     Comments: Cankersore in right lower gumline No dental abscess +Cervical lymphadenopathy R >L  No tonsillar exudate, enlargement No significant dental carries Eyes:     Conjunctiva/sclera: Conjunctivae normal.     Pupils: Pupils are equal, round, and reactive to light.  Cardiovascular:     Rate and Rhythm: Normal rate and regular rhythm.  Pulmonary:     Effort: Pulmonary effort is normal. No respiratory distress.  Abdominal:     General: There is no distension.     Tenderness: There is no abdominal tenderness.   Musculoskeletal:     Comments: Trigger point tenderness on upper back muscular exam  Skin:    General: Skin is warm and dry.  Neurological:     General: No focal deficit present.     Mental Status: She is alert and oriented to person, place, and time. Mental status is at baseline.  Psychiatric:        Mood and Affect: Mood normal.        Behavior: Behavior normal.     ED Results / Procedures / Treatments   Labs (all labs ordered are listed, but only abnormal results are displayed) Labs Reviewed  SARS CORONAVIRUS 2 (TAT 6-24 HRS)  TROPONIN I (HIGH SENSITIVITY)  TROPONIN I (HIGH SENSITIVITY)    EKG EKG Interpretation  Date/Time:  Wednesday February 24 2020 08:01:57 EST Ventricular Rate:  87 PR Interval:    QRS Duration: 85 QT Interval:  363 QTC Calculation: 437 R Axis:   38 Text Interpretation: Sinus rhythm No STEMI Confirmed by Alvester Chou [phone]) on 02/24/2020 8:58:37 AM   Radiology DG Chest Portable 1 View  Result Date: 02/24/2020 CLINICAL DATA:  30 year old female with a history of chest pain EXAM: PORTABLE CHEST 1 VIEW COMPARISON:  01/29/2020 FINDINGS: Cardiomediastinal silhouette within normal limits in size and contour. No pneumothorax. No pleural effusion. No airspace disease. No acute displaced fracture IMPRESSION: Negative for acute cardiopulmonary disease Electronically Signed   By: Gilmer Mor D.O.   On: 02/24/2020 08:32    Procedures Procedures (including critical care time)  Medications Ordered in ED Medications - No data to display  ED Course  I have reviewed the triage vital signs and the nursing notes.  Pertinent labs & imaging results that were available during my care of the patient were reviewed by me and considered in my medical decision making (see chart for details).  68 female presenting to the emergency department with suspected viral URI type syndrome, sick contacts in the house. We will test her for COVID. Her x-ray was ordered and  personally reviewed which not show any focal opacity, no evidence of pneumothorax, no widened mediastinum.  She does have several cardiac risk factors and has her first cardiology appointment at the end of the week. I think it is reasonable to attain a troponin test. EKG was ordered and personally reviewed which not show any acute ischemic findings per my interpretation.  I have a low suspicion for PE clinically. No evidence of bacterial PNA at this time  Mouth sore - cankersore - no dental abscess Neck pain likely 2/2 lymphadenopathy  *  Trop of 2.  Unlikely ACS.  Vitals remain stable.  Okay for discharge.  She reports she has an outpatient cardiology appointment scheduled for Friday already.  Olubunmi Rothenberger was evaluated in Emergency Department on 02/24/2020 for the symptoms described in the history of present illness.  She was evaluated in the context of the global COVID-19 pandemic, which necessitated consideration that the patient might be at risk for infection with the SARS-CoV-2 virus that causes COVID-19. Institutional protocols and algorithms that pertain to the evaluation of patients at risk for COVID-19 are in a state of rapid change based on information released by regulatory bodies including the CDC and federal and state organizations. These policies and algorithms were followed during the patient's care in the ED.   Clinical Course as of 02/24/20 1718  Wed Feb 24, 2020  1048 Trop 2, very unlikely to be ACS.  Okay to discharge. [MT]    Clinical Course User Index [MT] Johni Narine, Kermit Balo, MD    Final Clinical Impression(s) / ED Diagnoses Final diagnoses:  Viral illness  Chest pain, unspecified type    Rx / DC Orders ED Discharge Orders    None       Terald Sleeper, MD 02/24/20 1718

## 2020-02-24 NOTE — Discharge Instructions (Signed)
Your covid test should result in the next 24 hours.  Please follow up with your specialists as scheduled.

## 2020-02-24 NOTE — ED Triage Notes (Signed)
Pt reports soreness in the center of her chest along with body aches since yesterday. Pt denies cough, shortness of breath, n/v. Pt states her son has been sick at home this week.

## 2020-02-25 NOTE — Progress Notes (Signed)
Cardiology Office Note:   Date:  02/26/2020  NAME:  Rose Ramos    MRN: [id number] DOB:  10/05/1990   PCP:  Rose Forest, MD  Cardiologist:  No primary care provider on file.  Electrophysiologist:  None   Referring MD: Rose Forest, MD   Chief Complaint  Patient presents with  . Chest Pain   History of Present Illness:   Rose Ramos is a 30 y.o. female with a hx of HTN who is being seen today for the evaluation of mitral regurgitation at the request of Rose Forest, MD. Evaluated in [place] in 2016 and had mild MR and no MV prolapse. She reports for the last 3 days she has had episodes of achiness and soreness in her chest.  She describes pain that also goes into her back.  Can be sharp at times as well.  She reports that the symptoms are worse when she is upset or stressed out.  They are better when she calms down.  She reports has had no increased pain when she exerts herself.  Rest does not make it better.  It seems to be associated with stress.  She was given an inhaler due to a cough in the emergency room this is improved her symptoms as well.  She also reports back pain and leg pain.  She is concerned she may have a blood clot in her right leg.  Medical history significant for hypertension.  She does smoke.  EKG reviewed from the ER 2 days ago was normal sinus rhythm with no acute ischemic changes or evidence of infarction.  There was no concerning symptoms of pericarditis or changes consistent with discitis.  She does have a family history of heart disease in her father as well as uncles.  She has never had a heart attack or stroke.  I did review her echocardiogram from Rose Ramos in 2016 that showed mild mitral regurgitation.  She has no murmurs on examination.  She denies any exertional chest pain or pressure.  Not alleviated by rest.  Past Medical History: Past Medical History:  Diagnosis Date  . Hypertension   . Mitral valve prolapse     Past Surgical History: Past Surgical History:  Procedure Laterality Date  . ENDOMETRIAL ABLATION    . TUBAL LIGATION     Current Medications: Current Meds  Medication Sig  . amLODipine (NORVASC) 10 MG tablet Take 10 mg by mouth daily.  Marland Kitchen ibuprofen (ADVIL) 800 MG tablet Take 1 tablet (800 mg total) by mouth every 8 (eight) hours as needed.     Allergies:    Patient has no known allergies.   Social History: Social History   Socioeconomic History  . Marital status: Married    Spouse name: Not on file  . Number of children: 3  . Years of education: Not on file  . Highest education level: Not on file  Occupational History  . Not on file  Tobacco Use  . Smoking status: Current Every Day Smoker  . Smokeless tobacco: Never Used  Substance and Sexual Activity  . Alcohol use: Yes    Comment: 2-3 weeks  . Drug use: Never  . Sexual activity: Not on file  Other Topics Concern  . Not on file  Social History Narrative  . Not on file   Social Determinants of Health   Financial Resource Strain: Not on file  Food Insecurity: Not on file  Transportation Needs: Not on file  Physical  Activity: Not on file  Stress: Not on file  Social Connections: Not on file    Family History: The patient's family history includes Heart attack in her father and paternal uncle.  ROS:   All other ROS reviewed and negative. Pertinent positives noted in the HPI.     EKGs/Labs/Other Studies Reviewed:   The following studies were personally reviewed by me today:  EKG:  EKG was performed in the emergency room on 02/24/2020 which I personally reviewed in the office which demonstrated normal sinus rhythm heart rate 87, no acute ischemic changes, no evidence of prior infarction  Echo 2016 Rose Ramos) There is normal left ventricular wall thickness, cavity size, and systolic  function.  The estimated left ventricular ejection fraction is 60 - 65%.  Normal left ventricular diastolic filling  pattern.  The mitral valve appears structurally normal.  Mild mitral regurgitation is present.    Recent Labs: No results found for requested labs within last 8760 hours.   Recent Lipid Panel No results found for: CHOL, TRIG, HDL, CHOLHDL, VLDL, LDLCALC, LDLDIRECT  Physical Exam:   VS:  BP 117/74   Pulse 83   Ht 5\' 3"  (1.6 m)   Wt 218 lb 9.6 oz (99.2 kg)   SpO2 100%   BMI 38.72 kg/m    Wt Readings from Last 3 Encounters:  02/26/20 218 lb 9.6 oz (99.2 kg)  02/24/20 215 lb (97.5 kg)  11/15/16 217 lb (98.4 kg)    General: Well nourished, well developed, in no acute distress Head: Atraumatic, normal size  Eyes: PEERLA, EOMI  Neck: Supple, no JVD Endocrine: No thryomegaly Cardiac: Normal S1, S2; RRR; no murmurs, rubs, or gallops Lungs: Clear to auscultation bilaterally, no wheezing, rhonchi or rales  Abd: Soft, nontender, no hepatomegaly  Ext: No edema, pulses 2+ Musculoskeletal: No deformities, BUE and BLE strength normal and equal Skin: Warm and dry, no rashes   Neuro: Alert and oriented to person, place, time, and situation, CNII-XII grossly intact, no focal deficits  Psych: Normal mood and affect   ASSESSMENT:   Rose Ramos is a 30 y.o. female who presents for the following: 1. Chest pain of uncertain etiology   2. Nonrheumatic mitral valve regurgitation   3. Pain of right lower extremity   4. Primary hypertension   5. Tobacco abuse     PLAN:   1. Chest pain of uncertain etiology -Noncardiac chest pain.  Symptoms of soreness and achiness in her chest worse with stress and anxiety.  Alleviated by calm down.  EKG from the ER 2 days ago shows no concerning ischemic changes.  There is no evidence of pericarditis changes.  Her story is not consistent with pericarditis.  I would recommend she pursue stress reduction strategies and will take ibuprofen as needed.  She has noticed some improvement with albuterol inhaler.  She should continue this as well.  She does not need  a stress test.  She is too young for CAD.  Her EKG is very normal.  2. Nonrheumatic mitral valve regurgitation -History of mitral valve regurgitation mild in 2016.  Repeat echocardiogram.  Also history of mitral valve prolapse that was not mentioned on the report.  We will reevaluate this.  3. Pain of right lower extremity -Pain in the right lower extremity as well as well as back pain.  I suspect this is just related to stress and anxiety as well.  She is concerned about a blood clot.  We will check a D-dimer just  to make sure there is no blood clot.  4. Primary hypertension -BP well controlled on amlodipine.  5. Tobacco abuse -Smoking cessation advised.  Disposition: Return if symptoms worsen or fail to improve.  Medication Adjustments/Labs and Tests Ordered: Current medicines are reviewed at length with the patient today.  Concerns regarding medicines are outlined above.  Orders Placed This Encounter  Procedures  . D-Dimer, Quantitative  . ECHOCARDIOGRAM COMPLETE   Meds ordered this encounter  Medications  . ibuprofen (ADVIL) 800 MG tablet    Sig: Take 1 tablet (800 mg total) by mouth every 8 (eight) hours as needed.    Dispense:  15 tablet    Refill:  0    Patient Instructions  Medication Instructions:  Take Ibuprofen 800 mg as needed for chest pain  *If you need a refill on your cardiac medications before your next appointment, please call your pharmacy*   Lab Work: D-Dimer today   If you have labs (blood work) drawn today and your tests are completely normal, you will receive your results only by: Marland Kitchen MyChart Message (if you have MyChart) OR . A paper copy in the mail If you have any lab test that is abnormal or we need to change your treatment, we will call you to review the results.   Testing/Procedures:  Echocardiogram - Your physician has requested that you have an echocardiogram. Echocardiography is a painless test that uses sound waves to create images of  your heart. It provides your doctor with information about the size and shape of your heart and how well your heart's chambers and valves are working. This procedure takes approximately one hour. There are no restrictions for this procedure. This will be performed at our [facility] location - [address], [address].    Follow-Up: At [facility], you and your health needs are our priority.  As part of our continuing mission to provide you with exceptional heart care, we have created designated Provider Care Teams.  These Care Teams include your primary Cardiologist (physician) and Advanced Practice Providers (APPs -  Physician Assistants and Nurse Practitioners) who all work together to provide you with the care you need, when you need it.  We recommend signing up for the patient portal called "MyChart".  Sign up information is provided on this After Visit Summary.  MyChart is used to connect with patients for Virtual Visits (Telemedicine).  Patients are able to view lab/test results, encounter notes, upcoming appointments, etc.  Non-urgent messages can be sent to your provider as well.   To learn more about what you can do with MyChart, go to [email or web address].    Your next appointment:   As needed  The format for your next appointment:   In Person  Provider:   Lennie Odor, MD       Signed, Lenna Gilford. Flora Lipps, MD [facility]  [address], [address] [place], [place] [address] [phone]  02/26/2020 9:09 AM

## 2020-02-26 ENCOUNTER — Encounter: Payer: Self-pay | Admitting: Cardiovascular Disease

## 2020-02-26 ENCOUNTER — Other Ambulatory Visit: Payer: Self-pay

## 2020-02-26 ENCOUNTER — Other Ambulatory Visit: Payer: Self-pay | Admitting: Cardiovascular Disease

## 2020-02-26 ENCOUNTER — Ambulatory Visit (INDEPENDENT_AMBULATORY_CARE_PROVIDER_SITE_OTHER): Payer: Medicaid Other | Admitting: Cardiovascular Disease

## 2020-02-26 VITALS — BP 117/74 | HR 83 | Ht 63.0 in | Wt 218.6 lb

## 2020-02-26 DIAGNOSIS — R079 Chest pain, unspecified: Secondary | ICD-10-CM | POA: Diagnosis not present

## 2020-02-26 DIAGNOSIS — I10 Essential (primary) hypertension: Secondary | ICD-10-CM

## 2020-02-26 DIAGNOSIS — I340 Nonrheumatic mitral (valve) insufficiency: Secondary | ICD-10-CM | POA: Diagnosis not present

## 2020-02-26 DIAGNOSIS — Z720 Tobacco use: Secondary | ICD-10-CM

## 2020-02-26 DIAGNOSIS — M79604 Pain in right leg: Secondary | ICD-10-CM

## 2020-02-26 MED ORDER — IBUPROFEN 800 MG PO TABS: 800.0000 mg | ORAL_TABLET | Freq: Three times a day (TID) | ORAL | 0 refills | Status: DC | PRN

## 2020-02-26 MED FILL — IBUPROFEN 800 MG TAB: 800 | 5 days supply | Qty: 15 | Fill #0

## 2020-02-26 NOTE — Patient Instructions (Signed)
Medication Instructions:  Take Ibuprofen 800 mg as needed for chest pain  *If you need a refill on your cardiac medications before your next appointment, please call your pharmacy*   Lab Work: D-Dimer today   If you have labs (blood work) drawn today and your tests are completely normal, you will receive your results only by: Marland Kitchen MyChart Message (if you have MyChart) OR . A paper copy in the mail If you have any lab test that is abnormal or we need to change your treatment, we will call you to review the results.   Testing/Procedures:  Echocardiogram - Your physician has requested that you have an echocardiogram. Echocardiography is a painless test that uses sound waves to create images of your heart. It provides your doctor with information about the size and shape of your heart and how well your heart's chambers and valves are working. This procedure takes approximately one hour. There are no restrictions for this procedure. This will be performed at our [facility] location - [address].    Follow-Up: At [facility], you and your health needs are our priority.  As part of our continuing mission to provide you with exceptional heart care, we have created designated Provider Care Teams.  These Care Teams include your primary Cardiologist (physician) and Advanced Practice Providers (APPs -  Physician Assistants and Nurse Practitioners) who all work together to provide you with the care you need, when you need it.  We recommend signing up for the patient portal called "MyChart".  Sign up information is provided on this After Visit Summary.  MyChart is used to connect with patients for Virtual Visits (Telemedicine).  Patients are able to view lab/test results, encounter notes, upcoming appointments, etc.  Non-urgent messages can be sent to your provider as well.   To learn more about what you can do with MyChart, go to [email or web address].    Your next appointment:   As  needed  The format for your next appointment:   In Person  Provider:   Lennie Odor, MD

## 2020-02-26 NOTE — Addendum Note (Signed)
Addended by: Brunetta Genera on: 02/26/2020 04:16 PM   Modules accepted: Orders

## 2020-02-28 LAB — D-DIMER, QUANTITATIVE: D-DIMER: 0.56 mg/L FEU — ABNORMAL HIGH (ref 0.00–0.49)

## 2020-02-29 ENCOUNTER — Telehealth: Payer: Self-pay | Admitting: Cardiovascular Disease

## 2020-02-29 ENCOUNTER — Other Ambulatory Visit: Payer: Self-pay

## 2020-02-29 DIAGNOSIS — M79604 Pain in right leg: Secondary | ICD-10-CM

## 2020-02-29 NOTE — Progress Notes (Signed)
DVT venous scan ordered.  Sent message to scheduling.

## 2020-02-29 NOTE — Telephone Encounter (Signed)
Left message for patient to call and schedule Venous doppler ordered by Dr. Flora Lipps

## 2020-03-02 NOTE — Telephone Encounter (Signed)
Left message for patient to call and schedule venous doppler ordered by Dr. Flora Lipps.

## 2020-03-07 NOTE — Telephone Encounter (Signed)
Left message for patient to call and schedule venous doppler ordered by Dr.O'Neal

## 2020-03-09 NOTE — Telephone Encounter (Signed)
Spoke with patient to discuss scheduling the bilateral venous doppler ordered by Dr. Flora Lipps.  Patient states she has COVID and will call back when she is feeling better.

## 2020-03-10 ENCOUNTER — Other Ambulatory Visit (HOSPITAL_COMMUNITY): Payer: Medicaid Other

## 2020-03-18 ENCOUNTER — Other Ambulatory Visit (HOSPITAL_COMMUNITY): Payer: Medicaid Other

## 2020-03-24 NOTE — Telephone Encounter (Signed)
Noted. Will make Dr.O'Neal aware. Thank you!

## 2020-03-24 NOTE — Telephone Encounter (Signed)
Spoke with patient to schedule the vonous doppler ordered by Dr. Ebony Cargo states she is moving and will have testing done at her new cardiologist.

## 2020-04-29 ENCOUNTER — Ambulatory Visit (HOSPITAL_COMMUNITY): Payer: Medicaid Other | Attending: Internal Medicine

## 2020-04-29 ENCOUNTER — Encounter (HOSPITAL_COMMUNITY): Payer: Self-pay | Admitting: Cardiovascular Disease

## 2020-05-11 ENCOUNTER — Telehealth (HOSPITAL_COMMUNITY): Payer: Self-pay | Admitting: Cardiovascular Disease

## 2020-05-11 NOTE — Telephone Encounter (Signed)
Just an FYI. We have made several attempts to contact this patient including sending a letter to schedule or reschedule their echocardiogram. We will be removing the patient from the echo WQ.  04/29/20 NO SHOW- MAILED LETTER [facility]         Thank you

## 2020-05-11 NOTE — Telephone Encounter (Signed)
FYI

## 2021-04-11 IMAGING — CR DG CHEST 2V
2 series · 2 of 2 positions shown · non-contrast
Comparison: None.

CLINICAL DATA: 29-year-old female with a history of chronic cough

EXAM:
CHEST - 2 VIEW

[w chest pa]
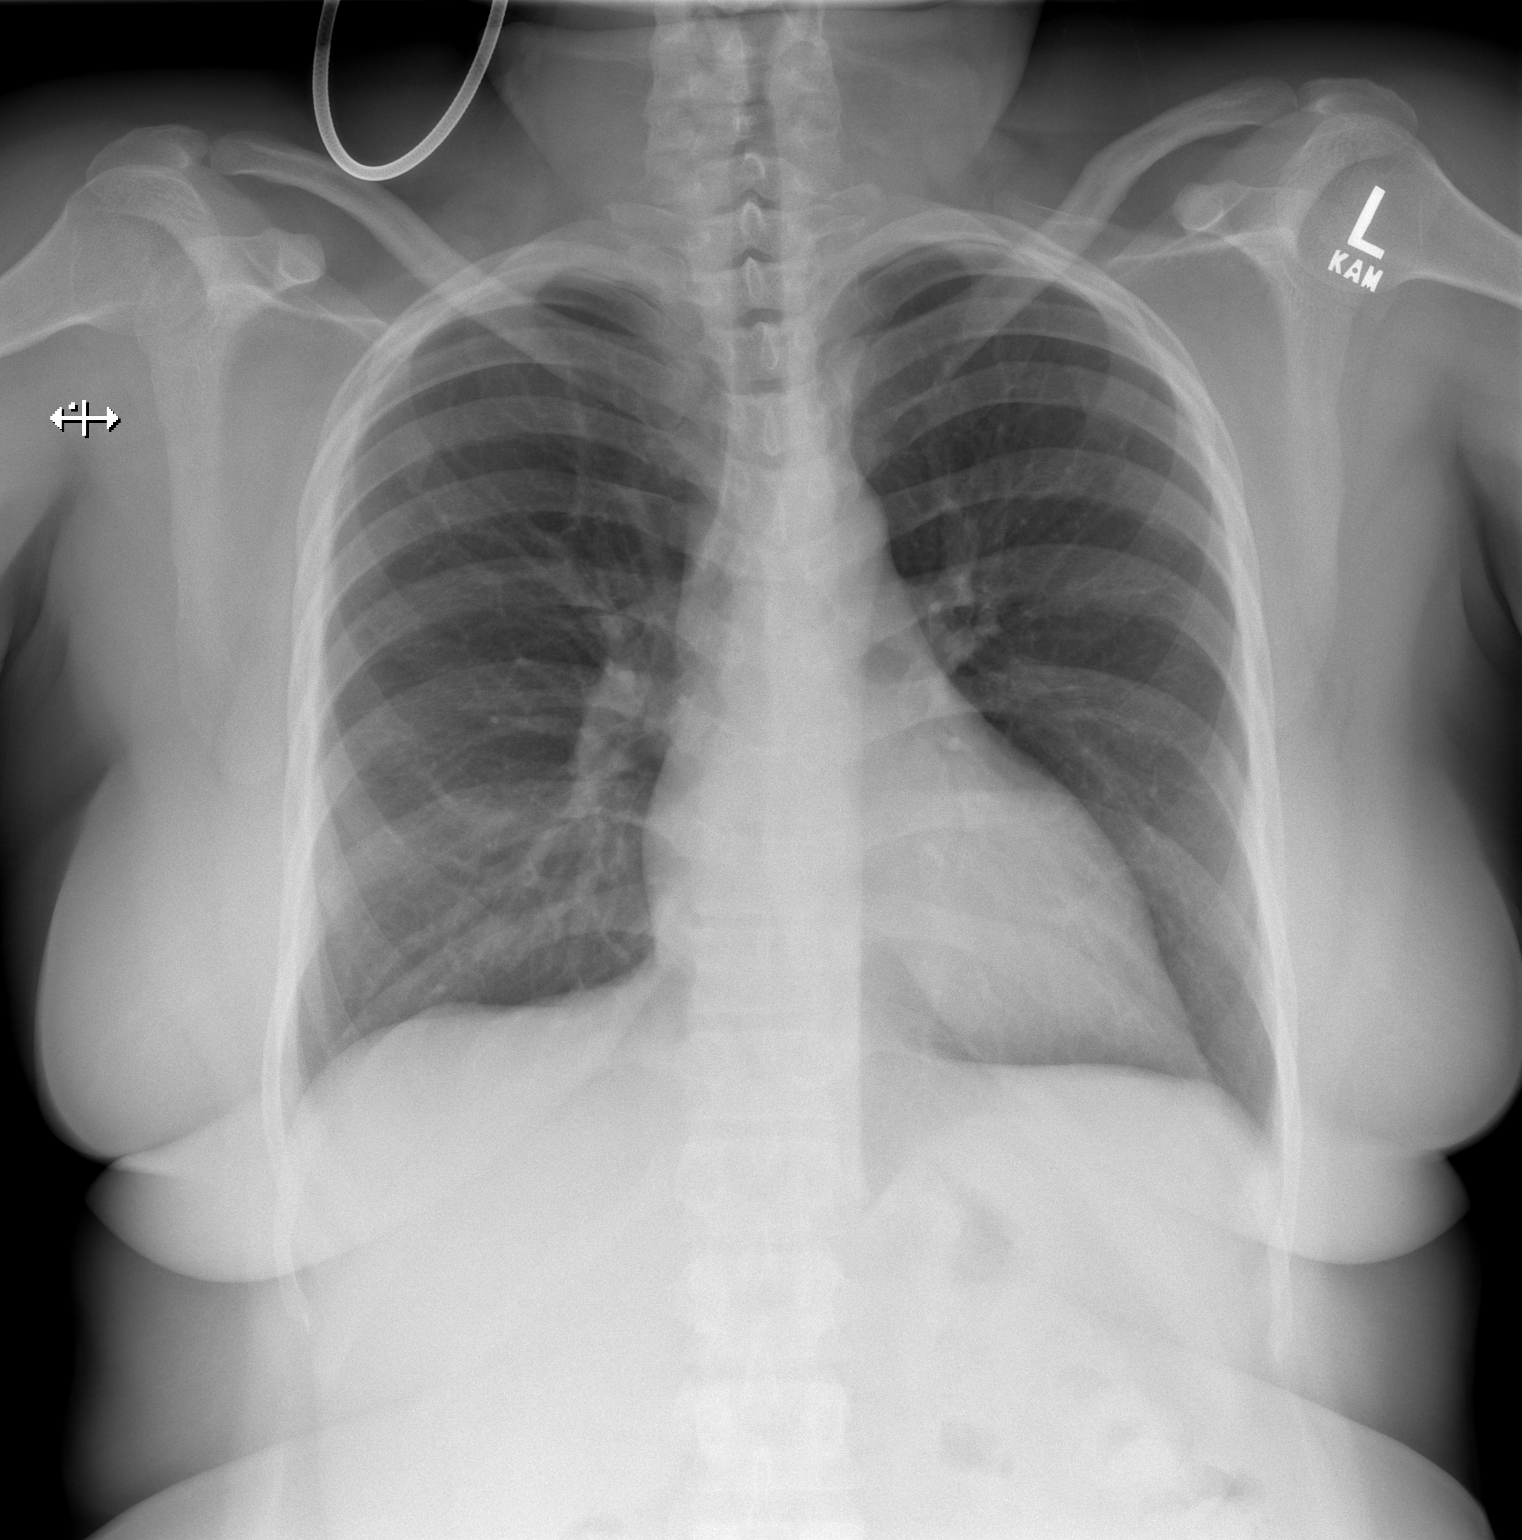

[w chest lat]
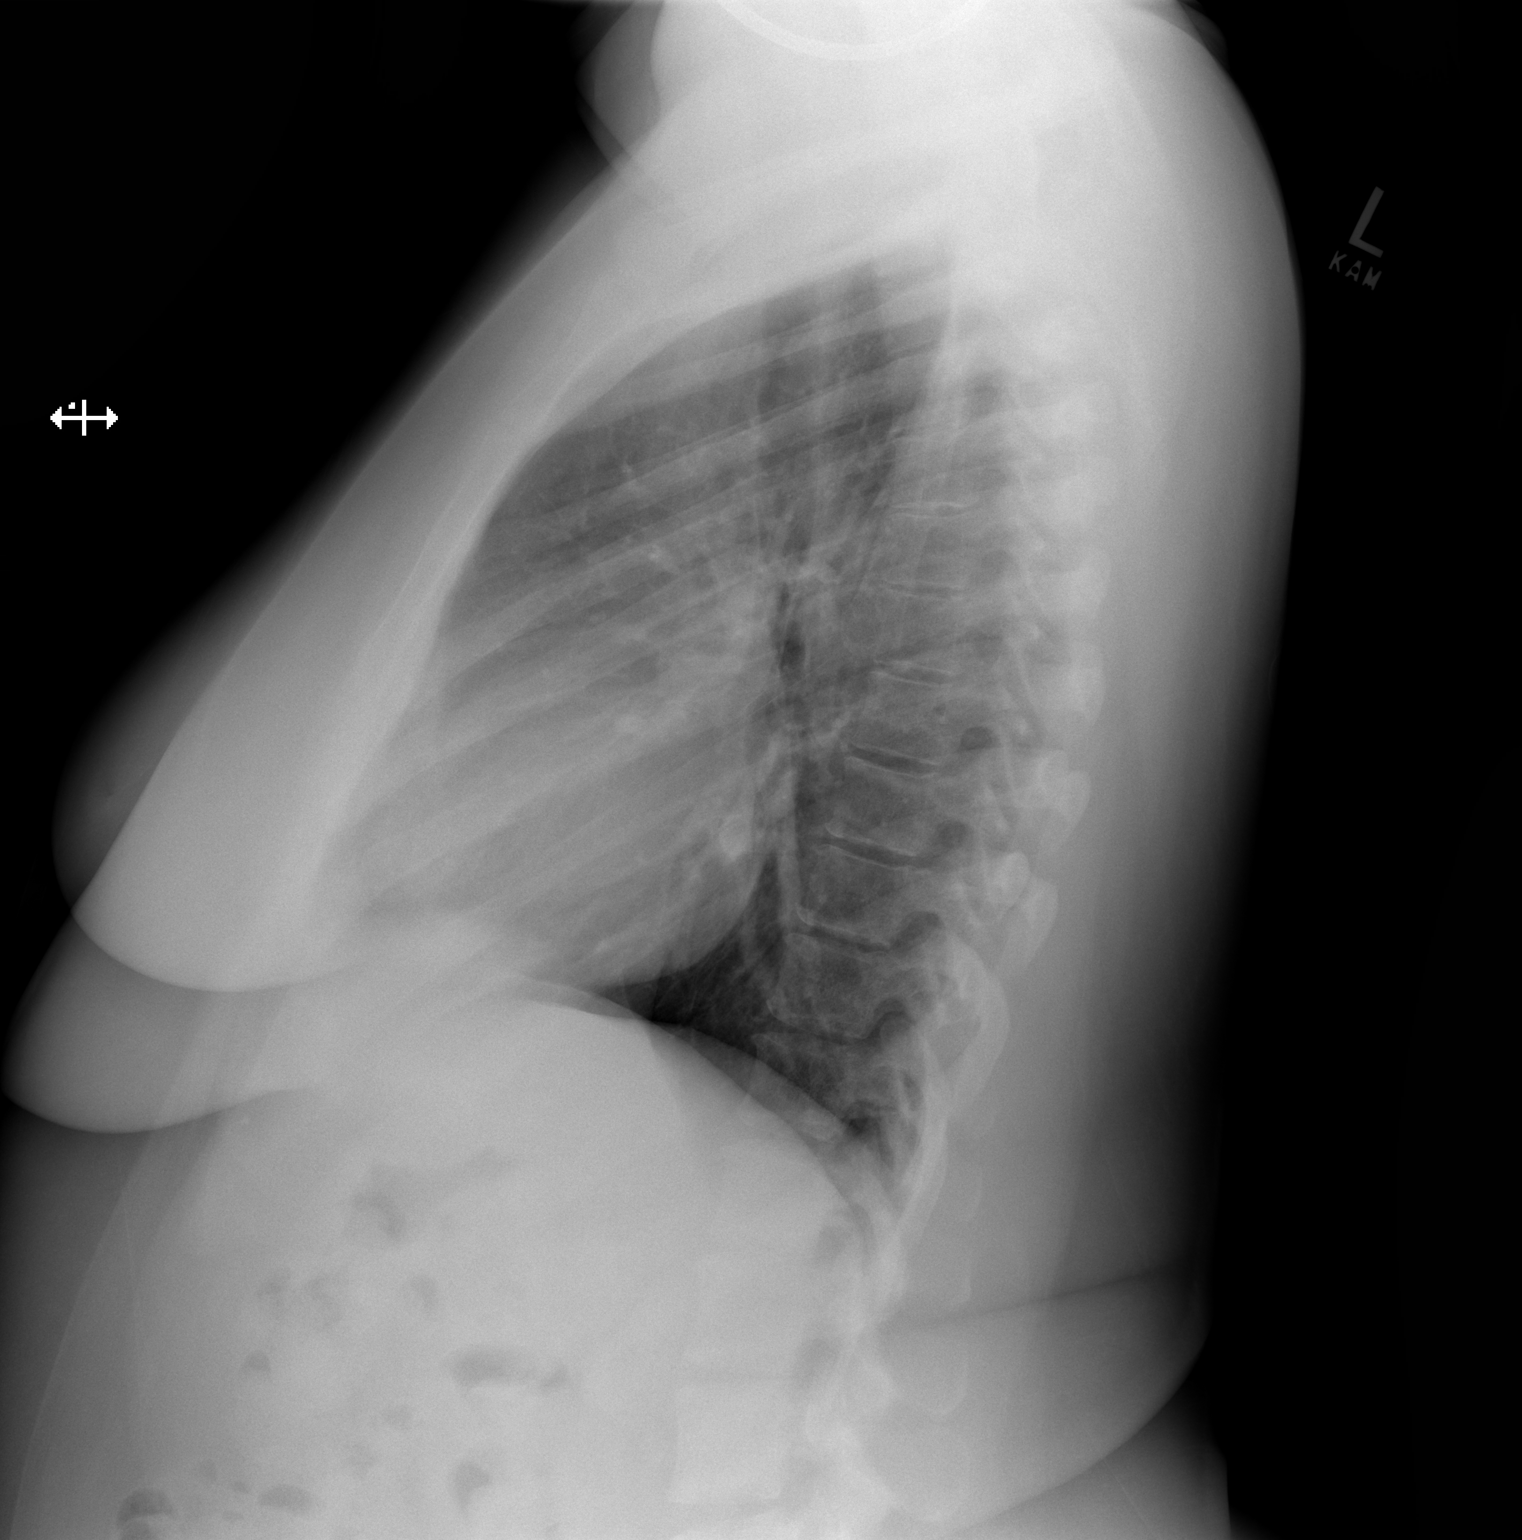

[2 of 2 positions shown; findings below may reference images not displayed]

FINDINGS: Cardiomediastinal silhouette within normal limits. No pneumothorax
or pleural effusion. No confluent airspace disease. No interlobular
septal thickening. No displaced fracture
IMPRESSION: Negative for acute cardiopulmonary disease
